# Patient Record
Sex: Female | Born: 1965 | Race: White | Hispanic: No | Marital: Married
Health system: Southern US, Community
[De-identification: ages and names within clinical notes are randomized; demographics above are authoritative.]

---

## 2018-12-06 DIAGNOSIS — Z9889 Other specified postprocedural states: Secondary | ICD-10-CM | POA: Insufficient documentation

## 2019-10-08 DIAGNOSIS — K219 Gastro-esophageal reflux disease without esophagitis: Secondary | ICD-10-CM | POA: Insufficient documentation

## 2019-10-08 DIAGNOSIS — E669 Obesity, unspecified: Secondary | ICD-10-CM | POA: Insufficient documentation

## 2019-10-08 DIAGNOSIS — M159 Polyosteoarthritis, unspecified: Secondary | ICD-10-CM | POA: Insufficient documentation

## 2019-10-08 DIAGNOSIS — M8949 Other hypertrophic osteoarthropathy, multiple sites: Secondary | ICD-10-CM | POA: Insufficient documentation

## 2019-10-08 DIAGNOSIS — Z9889 Other specified postprocedural states: Secondary | ICD-10-CM | POA: Insufficient documentation

## 2019-10-08 DIAGNOSIS — I10 Essential (primary) hypertension: Secondary | ICD-10-CM | POA: Insufficient documentation

## 2019-10-08 DIAGNOSIS — M150 Primary generalized (osteo)arthritis: Secondary | ICD-10-CM | POA: Insufficient documentation

## 2020-03-18 DIAGNOSIS — F411 Generalized anxiety disorder: Secondary | ICD-10-CM | POA: Insufficient documentation

## 2020-03-18 DIAGNOSIS — F332 Major depressive disorder, recurrent severe without psychotic features: Secondary | ICD-10-CM | POA: Insufficient documentation

## 2021-01-09 ENCOUNTER — Other Ambulatory Visit: Payer: Self-pay | Admitting: Podiatry

## 2021-01-09 ENCOUNTER — Other Ambulatory Visit: Payer: Self-pay

## 2021-01-09 ENCOUNTER — Ambulatory Visit (INDEPENDENT_AMBULATORY_CARE_PROVIDER_SITE_OTHER): Payer: 59 | Admitting: Podiatry

## 2021-01-09 ENCOUNTER — Ambulatory Visit (INDEPENDENT_AMBULATORY_CARE_PROVIDER_SITE_OTHER): Payer: 59

## 2021-01-09 ENCOUNTER — Other Ambulatory Visit: Payer: Self-pay | Admitting: Internal Medicine

## 2021-01-09 DIAGNOSIS — S92505A Nondisplaced unspecified fracture of left lesser toe(s), initial encounter for closed fracture: Secondary | ICD-10-CM | POA: Diagnosis not present

## 2021-01-09 DIAGNOSIS — M19071 Primary osteoarthritis, right ankle and foot: Secondary | ICD-10-CM

## 2021-01-09 DIAGNOSIS — M5416 Radiculopathy, lumbar region: Secondary | ICD-10-CM | POA: Diagnosis not present

## 2021-01-09 DIAGNOSIS — M1990 Unspecified osteoarthritis, unspecified site: Secondary | ICD-10-CM

## 2021-01-09 DIAGNOSIS — M5126 Other intervertebral disc displacement, lumbar region: Secondary | ICD-10-CM

## 2021-01-09 DIAGNOSIS — S99922A Unspecified injury of left foot, initial encounter: Secondary | ICD-10-CM

## 2021-01-09 DIAGNOSIS — M5136 Other intervertebral disc degeneration, lumbar region: Secondary | ICD-10-CM

## 2021-01-09 NOTE — Progress Notes (Signed)
   HPI: 55 y.o. female presenting today as a new patient with her husband for evaluation of 2 separate complaints.  First, the patient states that a few weeks ago she stubbed her toe and she sustained a slip and fall injury when she was bathing her dog.  She has been having left forefoot pain since the injury.  Patient also states that for the last several years, approximately 4 years, she has had pain and tenderness to the bilateral feet.  It feels as if there is a tight band around her feet.  She also wakes up in the middle of the night with foot pain.  She does have history of laminectomy to the L4-L5 region.  Last back surgery was 2020.  She presents for further treatment and evaluation  No past medical history on file.   Physical Exam: General: The patient is alert and oriented x3 in no acute distress.  Dermatology: Skin is warm, dry and supple bilateral lower extremities. Negative for open lesions or macerations.  Vascular: Palpable pedal pulses bilaterally. No edema or erythema noted. Capillary refill within normal limits.  Neurological: Epicritic and protective threshold grossly intact bilaterally.   Musculoskeletal Exam: Range of motion within normal limits to all pedal and ankle joints bilateral. Muscle strength 5/5 in all groups bilateral.  Pain on palpation to the fourth toe left foot.  Negative for any significant edema  Radiographic Exam:  Closed nondisplaced fracture noted throughout the proximal phalanx of the left fourth toe. There is some mild to moderate degenerative changes noted throughout the midtarsal joints of the foot  Assessment: 1.  Fracture fourth toe left, closed, nondisplaced, initial encounter 2.  Lumbar radiculopathy bilateral lower extremities   Plan of Care:  1. Patient evaluated. X-Rays reviewed.  2.  Postsurgical shoe dispensed.  Weightbearing as tolerated left foot.  Explained to the patient that this toe fracture should heal with time approximately 8  weeks 3.  In regards to the bilateral chronic foot pain for the past 4 years, I did explain and I do believe that the pain is more of a nerve etiology.  I do suspect that the pain is coming from her lumbar sacral region.  Especially given the history of laminectomy and chronic pain to this area.  She also experiences the pain radiating up to the legs and thighs.  Recommend follow-up with her neuro spine specialist 4.  Return to clinic as needed      Felecia Shelling, DPM [facility]  Dr. Felecia Shelling, DPM    [address] [place], [place] [address]                Office [phone]  Fax [phone]

## 2021-01-30 ENCOUNTER — Ambulatory Visit
Admission: RE | Admit: 2021-01-30 | Discharge: 2021-01-30 | Disposition: A | Discharge status: Home / Self Care | Payer: Disability Insurance | Source: Ambulatory Visit | Attending: Internal Medicine | Admitting: Internal Medicine

## 2021-01-30 DIAGNOSIS — M1990 Unspecified osteoarthritis, unspecified site: Secondary | ICD-10-CM | POA: Diagnosis present

## 2021-01-30 DIAGNOSIS — M5126 Other intervertebral disc displacement, lumbar region: Secondary | ICD-10-CM | POA: Insufficient documentation

## 2021-01-30 DIAGNOSIS — M5136 Other intervertebral disc degeneration, lumbar region: Secondary | ICD-10-CM

## 2021-03-20 ENCOUNTER — Ambulatory Visit (LOCAL_COMMUNITY_HEALTH_CENTER): Payer: Self-pay

## 2021-03-20 ENCOUNTER — Other Ambulatory Visit: Payer: Self-pay

## 2021-03-20 DIAGNOSIS — Z111 Encounter for screening for respiratory tuberculosis: Secondary | ICD-10-CM

## 2021-03-23 ENCOUNTER — Ambulatory Visit (LOCAL_COMMUNITY_HEALTH_CENTER): Payer: Disability Insurance

## 2021-03-23 ENCOUNTER — Other Ambulatory Visit: Payer: Self-pay

## 2021-03-23 DIAGNOSIS — Z111 Encounter for screening for respiratory tuberculosis: Secondary | ICD-10-CM

## 2021-03-23 LAB — TB SKIN TEST
Induration: 0 mm
TB Skin Test: NEGATIVE

## 2021-09-21 ENCOUNTER — Ambulatory Visit
Admission: RE | Admit: 2021-09-21 | Discharge: 2021-09-21 | Disposition: A | Discharge status: Home / Self Care | Payer: Disability Insurance | Attending: Internal Medicine | Admitting: Internal Medicine

## 2021-09-21 ENCOUNTER — Other Ambulatory Visit: Payer: Self-pay | Admitting: Internal Medicine

## 2021-09-21 ENCOUNTER — Ambulatory Visit
Admission: RE | Admit: 2021-09-21 | Discharge: 2021-09-21 | Disposition: A | Discharge status: Home / Self Care | Payer: Disability Insurance | Source: Ambulatory Visit | Attending: Internal Medicine | Admitting: Internal Medicine

## 2021-09-21 DIAGNOSIS — M1990 Unspecified osteoarthritis, unspecified site: Secondary | ICD-10-CM

## 2022-11-30 IMAGING — CR DG KNEE 1-2V*R*
1 series · 2 of 2 positions shown · non-contrast
Comparison: None.

CLINICAL DATA: Arthritis

EXAM:
RIGHT KNEE - 1-2 VIEW; LEFT KNEE - 1-2 VIEW

[Series 1: dg knee 1-2 views right · 0.14mm/px · 2 of 2 slices shown]
[im 1/2]
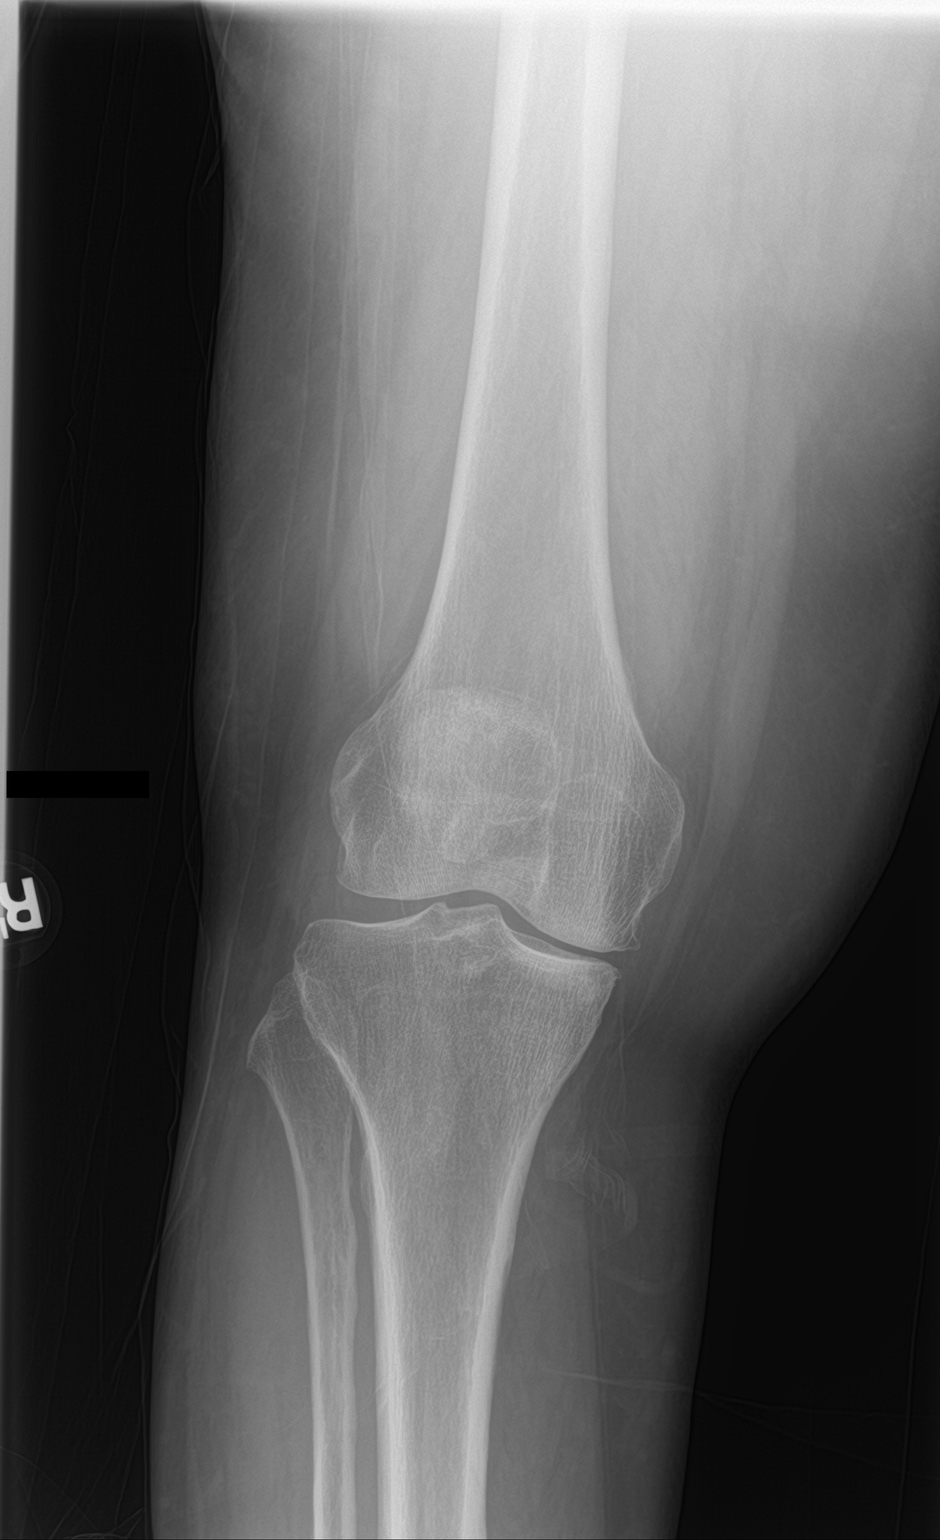
[im 2/2]
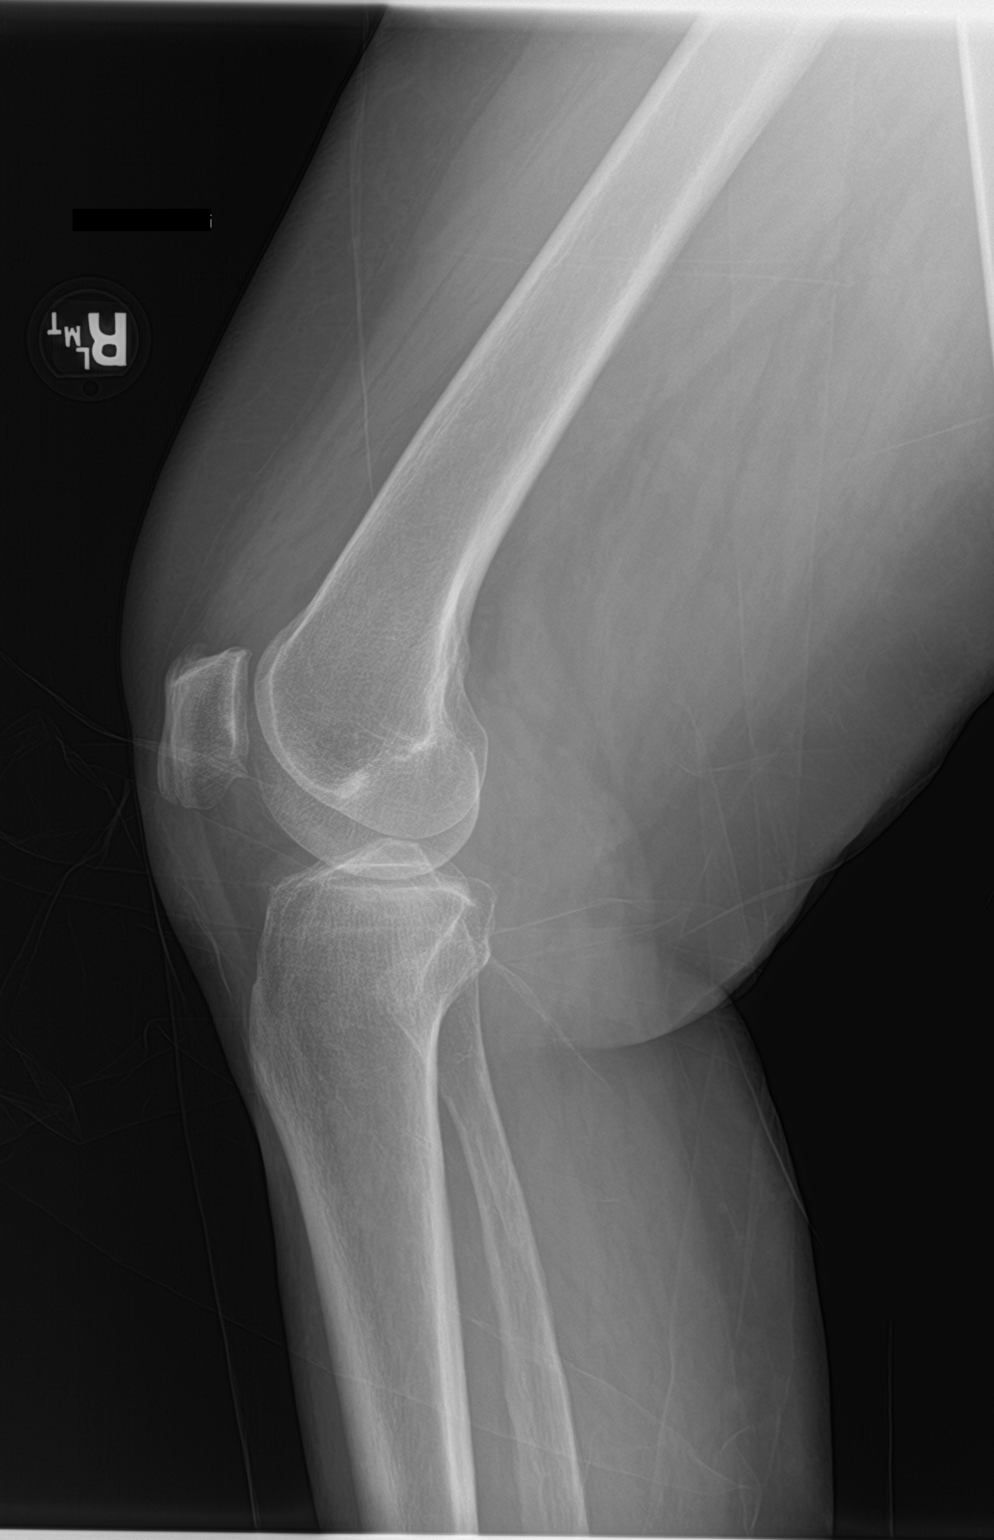

[2 of 2 positions shown; findings below may reference images not displayed]

FINDINGS: Right knee:

Normal alignment. No acute fracture. Moderate degenerative changes
of the medial joint compartment. Small patellar enthesophytes. No
joint effusion.

Left knee:

Normal alignment. No acute fracture. Small patellar enthesophytes of
the quadriceps insertion. Mild degenerative changes of the medial
joint compartment. No joint effusion.
IMPRESSION: Bilateral knees:

Degenerative changes primarily of the medial joint compartment of
both knees, greater on the right.

## 2022-11-30 IMAGING — CR DG LUMBAR SPINE 2-3V
1 series · 2 of 2 positions shown · non-contrast
Comparison: January 2021

CLINICAL DATA: Arthritis

EXAM:
LUMBAR SPINE - 2-3 VIEW

[Series 1: dg lumbar spine 2-3 views · 0.14mm/px · 2 of 2 slices shown]
[im 1/2]
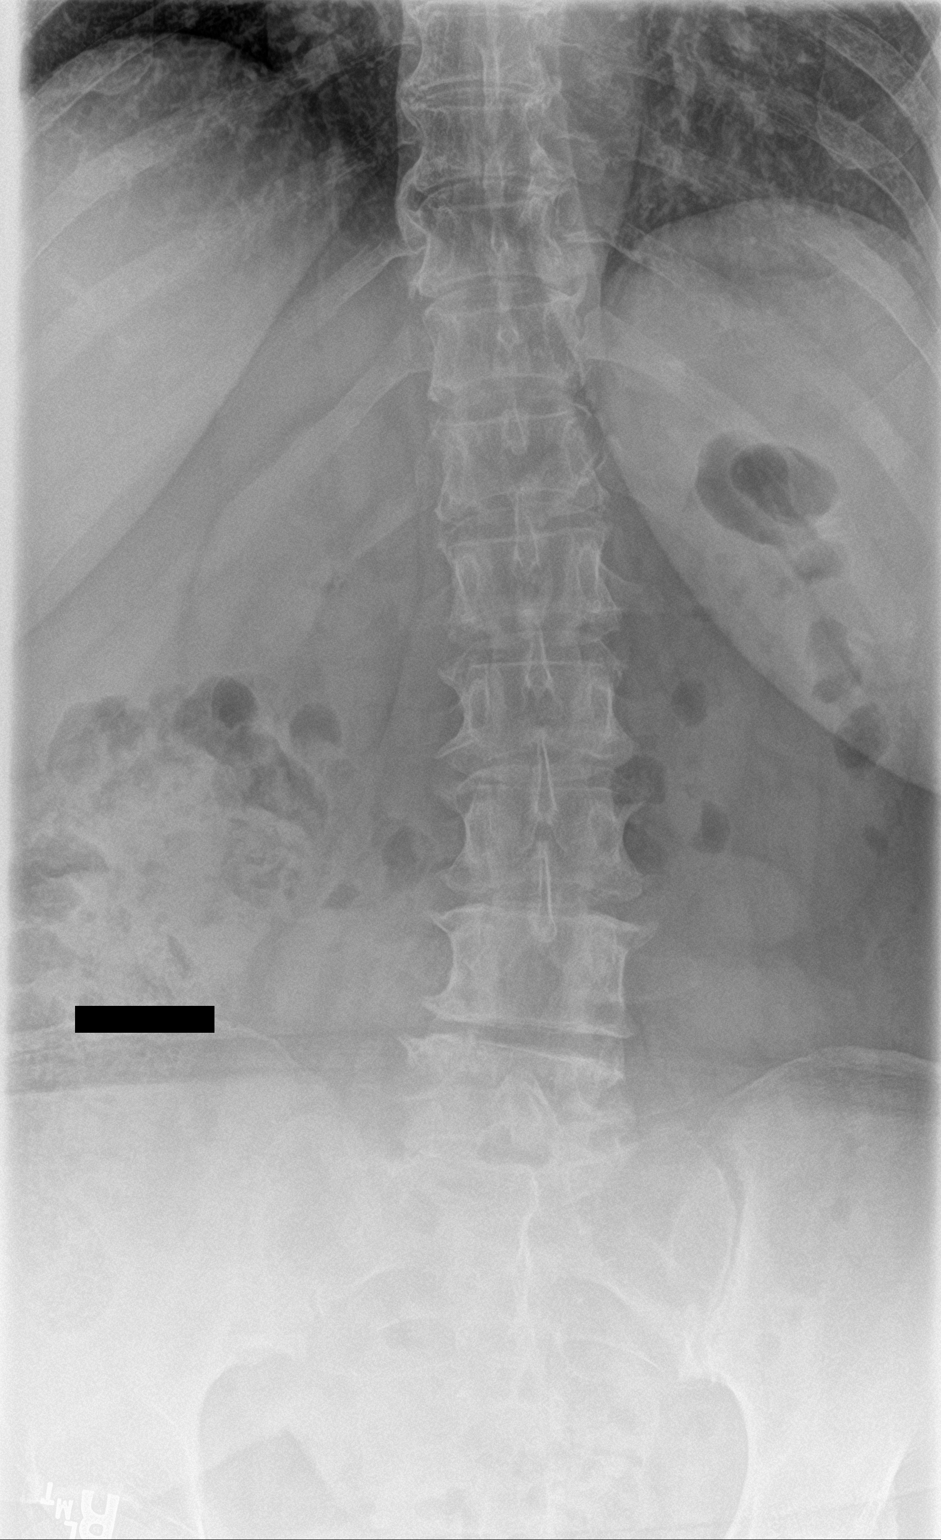
[im 2/2]
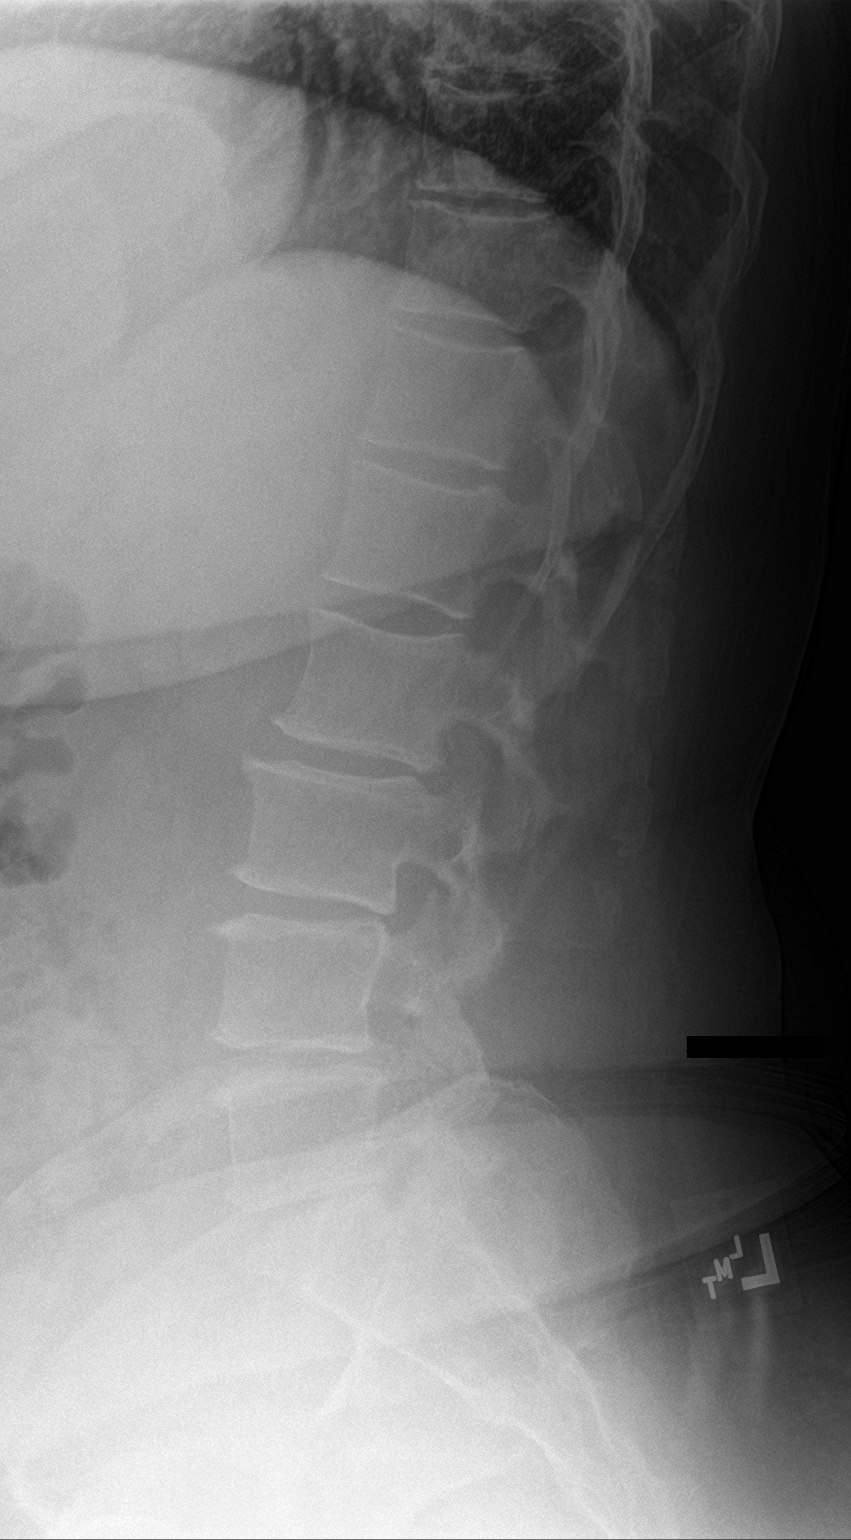

[2 of 2 positions shown; findings below may reference images not displayed]

FINDINGS: There are 5 non-rib-bearing lumbar-type vertebral bodies. There is
very mild left convex curvature of the lumbar spine centered at
approximately L3-L4. The lumbar vertebral body heights are
maintained without compression deformity. There are multilevel
degenerative disc changes throughout the lumbar spine, approximately
moderate in severity and affecting L2-L3 through L5-S1.
IMPRESSION: Degenerative disc disease of the lumbar spine involving L2-L3
through L5-S1.

## 2024-08-01 ENCOUNTER — Encounter: Payer: Self-pay | Admitting: Neurosurgery

## 2024-08-01 ENCOUNTER — Telehealth: Payer: Self-pay | Admitting: Neurosurgery

## 2024-08-01 NOTE — Telephone Encounter (Signed)
 Requested images from [facility] via [facility] 04/06/2024- MRI Lumbar Spine without Contrast 03/19/2024- Xray Lumbar Spine with flexion and extension

## 2024-08-01 NOTE — Telephone Encounter (Signed)
 Patient called to request a second opinion for her lower back. She states she had a laminectomy done in 2020 at [facility] and I have requested those records. Can her lumbar MRI from 04/06/2024 be requested from [facility]?

## 2024-08-02 ENCOUNTER — Inpatient Hospital Stay
Admission: RE | Admit: 2024-08-02 | Discharge: 2024-08-02 | Disposition: A | Discharge status: Home / Self Care | Payer: Self-pay | Source: Ambulatory Visit | Attending: Neurosurgery | Admitting: Neurosurgery

## 2024-08-02 ENCOUNTER — Encounter: Payer: Self-pay | Admitting: Neurosurgery

## 2024-08-02 ENCOUNTER — Other Ambulatory Visit: Payer: Self-pay | Admitting: Family Medicine

## 2024-08-02 DIAGNOSIS — Z049 Encounter for examination and observation for unspecified reason: Secondary | ICD-10-CM

## 2024-08-02 NOTE — Telephone Encounter (Signed)
 Images are in and ready to view.

## 2024-08-07 NOTE — Telephone Encounter (Signed)
 [facility] notes scanned into the chart. Could you please review and advise if she can be scheduled with you?

## 2024-08-08 NOTE — Telephone Encounter (Signed)
 Left message to call our office back.

## 2024-08-08 NOTE — Telephone Encounter (Signed)
 Patient scheduled for 08/16/2024 in [place].

## 2024-08-10 NOTE — Progress Notes (Unsigned)
 "   Referring Physician:  Rudy Alyce RAMAN, MD [address] [place],  [place] [address]  Primary Physician:  Rudy Alyce RAMAN, MD  History of Present Illness: 08/10/2024 Mary Barton is here today with a chief complaint of ***   2nd opinion request, ok per [facility]  Back pain? Leg pain?   Duration: *** Location: *** Quality: *** Severity: ***  Precipitating: aggravated by *** Modifying factors: made better by *** Weakness: none Timing: *** Bowel/Bladder Dysfunction: none  Conservative measures:  Physical therapy: *** no recent physical therapy within the past year Multimodal medical therapy including regular antiinflammatories: *** ibuprofen, meloxicam Injections: ***  06/26/24: Bilateral L5-S1 TF ESI by Dr. Tamsen   Past Surgery: *** Lumbar Laminectomy in 2020  Mary Barton has ***no symptoms of cervical myelopathy.  The symptoms are causing a significant impact on the patient's life.   I have utilized the care everywhere function in epic to review the outside records available from external health systems.   Review of Systems:  A 10 point review of systems is negative, except for the pertinent positives and negatives detailed in the HPI.  Past Medical History: No past medical history on file.  Past Surgical History: *** The histories are not reviewed yet. Please review them in the History navigator section and refresh this SmartLink.  Allergies: Allergies as of 08/16/2024 - Review Complete 03/20/2021  Allergen Reaction Noted   Venlafaxine Other (See Comments) 06/20/2017   Duloxetine Other (See Comments) 10/16/2020   Lisinopril Cough 09/06/2016    Medications: Current Medications[1]  Social History: Social History[2]  Family Medical History: No family history on file.  Physical Examination: There were no vitals filed for this visit.  General: Patient is in no apparent distress. Attention to examination is appropriate.  Neck:    Supple.  Full range of motion.  Respiratory: Patient is breathing without any difficulty.   NEUROLOGICAL:     Awake, alert, oriented to person, place, and time.  Speech is clear and fluent.   Cranial Nerves: Pupils equal round and reactive to light.  Facial tone is symmetric.  Facial sensation is symmetric. Shoulder shrug is symmetric. Tongue protrusion is midline.  There is no pronator drift.  Strength: Side Biceps Triceps Deltoid Interossei Grip Wrist Ext. Wrist Flex.  R 5 5 5 5 5 5 5   L 5 5 5 5 5 5 5    Side Iliopsoas Quads Hamstring PF DF EHL  R 5 5 5 5 5 5   L 5 5 5 5 5 5    Reflexes are ***2+ and symmetric at the biceps, triceps, brachioradialis, patella and achilles.   Hoffman's is absent.   Bilateral upper and lower extremity sensation is intact to light touch.    No evidence of dysmetria noted.  Gait is normal.     Medical Decision Making  Imaging: ***  I have personally reviewed the images and agree with the above interpretation.  Assessment and Plan: [name] is a pleasant 59 y.o. female with ***      Thank you for involving me in the care of this patient.      Chester K. Clois MD, [facility] Neurosurgery     [1]  Current Outpatient Medications:    ibuprofen (ADVIL) 800 MG tablet, Take by mouth., Disp: , Rfl:    losartan (COZAAR) 100 MG tablet, Take by mouth., Disp: , Rfl:    meloxicam (MOBIC) 15 MG tablet, Take by mouth., Disp: , Rfl:  traZODone (DESYREL) 50 MG tablet, TAKE 1 TO 2 TABLETS AT BEDTIME AS DIRECTED BY PHYSICIAN, Disp: , Rfl:  [2]    "

## 2024-08-16 ENCOUNTER — Ambulatory Visit
